# Patient Record
Sex: Male | Born: 1970 | Race: Asian | Hispanic: No | Marital: Single | Smoking: Former smoker
Health system: Southern US, Community
[De-identification: ages and names within clinical notes are randomized; demographics above are authoritative.]

---

## 2013-06-06 ENCOUNTER — Ambulatory Visit (INDEPENDENT_AMBULATORY_CARE_PROVIDER_SITE_OTHER): Payer: BC Managed Care – PPO | Admitting: Emergency Medicine

## 2013-06-06 VITALS — BP 122/74 | HR 80 | Temp 98.0°F | Resp 17 | Ht 64.5 in | Wt 163.0 lb

## 2013-06-06 DIAGNOSIS — S335XXA Sprain of ligaments of lumbar spine, initial encounter: Secondary | ICD-10-CM

## 2013-06-06 MED ORDER — NAPROXEN SODIUM 550 MG PO TABS: 550.0000 mg | ORAL_TABLET | Freq: Two times a day (BID) | ORAL | Status: DC

## 2013-06-06 MED ORDER — CYCLOBENZAPRINE HCL 10 MG PO TABS: 10.0000 mg | ORAL_TABLET | Freq: Three times a day (TID) | ORAL | Status: AC | PRN

## 2013-06-06 MED ORDER — TRAMADOL HCL 50 MG PO TABS: 50.0000 mg | ORAL_TABLET | Freq: Three times a day (TID) | ORAL | Status: AC | PRN

## 2013-06-06 NOTE — Progress Notes (Signed)
[facility] [address], [place] [address] [phone]  Date:  06/06/2013   Name:  Danny Lin   DOB:  Jan 08, 1971   MRN:  [id number]  PCP:  No primary provider on file.    Chief Complaint: Back Pain   History of Present Illness:  Artice Bergerson is a 42 y.o. very pleasant male patient who presents with the following:  3 day history of right buttock pain into right thigh posteriorly. No neuro symptoms.  No history of injury.  Works in a [employer] and is constantly bent, squatting or hunched over.  No prior history of low back problems. No improvement with over the counter medications or other home remedies. Denies other complaint or health concern today.   There are no active problems to display for this patient.   No past medical history on file.  No past surgical history on file.  History  Substance Use Topics  . Smoking status: Never Smoker   . Smokeless tobacco: Not on file  . Alcohol Use: Not on file    No family history on file.  No Known Allergies  Medication list has been reviewed and updated.  No current outpatient prescriptions on file prior to visit.   No current facility-administered medications on file prior to visit.    Review of Systems:  As per HPI, otherwise negative.    Physical Examination: Filed Vitals:   06/06/13 0935  BP: 122/74  Pulse: 80  Temp: 98 F (36.7 C)  Resp: 17   Filed Vitals:   06/06/13 0935  Height: 5' 4.5" (1.638 m)  Weight: 163 lb (73.936 kg)   Body mass index is 27.56 kg/(m^2). Ideal Body Weight: Weight in (lb) to have BMI = 25: 147.6   GEN: WDWN, NAD, Non-toxic, Alert & Oriented x 3 HEENT: Atraumatic, Normocephalic.  Ears and Nose: No external deformity. EXTR: No clubbing/cyanosis/edema NEURO: Normal gait.  PSYCH: Normally interactive. Conversant. Not depressed or anxious appearing.  Calm demeanor.  BACK:  Supple and not tender.  Neuro intact.   Assessment and Plan: Lumbar  strain Anaprox Flexeril Ultram   Signed,  Phillips Odor, MD

## 2013-06-06 NOTE — Patient Instructions (Signed)

## 2013-06-28 ENCOUNTER — Ambulatory Visit (INDEPENDENT_AMBULATORY_CARE_PROVIDER_SITE_OTHER): Payer: No Typology Code available for payment source | Admitting: Family Medicine

## 2013-06-28 VITALS — BP 117/88 | HR 80 | Temp 97.7°F | Resp 18 | Wt 161.0 lb

## 2013-06-28 DIAGNOSIS — M629 Disorder of muscle, unspecified: Secondary | ICD-10-CM

## 2013-06-28 DIAGNOSIS — M25559 Pain in unspecified hip: Secondary | ICD-10-CM

## 2013-06-28 DIAGNOSIS — M25551 Pain in right hip: Secondary | ICD-10-CM

## 2013-06-28 DIAGNOSIS — M7631 Iliotibial band syndrome, right leg: Secondary | ICD-10-CM

## 2013-06-28 MED ORDER — NAPROXEN SODIUM 550 MG PO TABS: 550.0000 mg | ORAL_TABLET | Freq: Two times a day (BID) | ORAL | Status: AC

## 2013-06-28 NOTE — Patient Instructions (Signed)
Iliotibial Band Syndrome  with Rehab  The iliotibial (IT) band is a tendon that connects the hip muscles to the shinbone (tibia) and to one of the bones of the pelvis (ileum). The IT band passes by the knee and is often irritated by the outer portion of the knee (lateral femoral condyle). A fluid filled sac (bursa) exists between the tendon and the bone, to cushion and reduce friction. Overuse of the tendon may cause excessive friction, which results in IT band syndrome. This condition involves inflammation of the bursa (bursitis) and/or inflammation of the IT band (tendinitis).  SYMPTOMS   · Pain, tenderness, swelling, warmth, or redness over the IT band, at the outer knee (above the joint).  · Pain that travels up or down the thigh or leg.  · Initially, pain at the beginning of an exercise, that decreases once warmed up. Eventually, pain throughout the activity, getting worse as the activity continues. May cause the athlete to stop in the middle of training or competing.  · Pain that gets worse when running down hills or stairs, on banked tracks, or next to the curb on the street.  · Pain that increases when the foot of the affected leg hits the ground.  · Possibly, a crackling sound (crepitation) when the tendon or bursa is moved or touched.  CAUSES   IT band syndrome is caused by irritation of the IT band and the underlying bursa. This eventually results in inflammation and pain. IT band syndrome is an overuse injury.   RISK INCREASES WITH:  · Sports with repetitive knee-bending activities (distance running, cycling).  · Incorrect training techniques, including sudden changes in the intensity, frequency, or duration of training.  · Not enough rest between workouts.  · Poor strength and flexibility, especially a tight IT band.  · Failure to warm up properly before activity.  · Bow legs.  · Arthritis of the knee.  PREVENTION   · Warm up and stretch properly before activity.  · Allow for adequate recovery between  workouts.  · Maintain physical fitness:  · Strength, flexibility, and endurance.  · Cardiovascular fitness.  · Learn and use proper training technique, including reducing running mileage, shortening stride, and avoiding running on hills and banked surfaces.  · Wear arch supports (orthotics), if you have flat feet.  PROGNOSIS   If treated properly, IT band syndrome usually goes away within 6 weeks of treatment.  RELATED COMPLICATIONS   · Longer healing time, if not properly treated, or if not given enough time to heal.  · Recurring inflammation of the tendon and bursa, that may result in a chronic condition.  · Recurring symptoms, if activity is resumed too soon, with overuse, with a direct blow, or with poor training technique.  · Inability to complete training or competition.  TREATMENT   Treatment first involves the use of ice and medicine, to reduce pain and inflammation. The use of strengthening and stretching exercises may help reduce pain with activity. These exercises may be performed at home or with a therapist. For individuals with flat feet, an arch support (orthotic) may be helpful. Some individuals find that wearing a knee sleeve or compression bandage around the knee during workouts provides some relief. Certain training techniques, such as adjusting stride length, avoiding running on hills or stairs, changing the direction you run on a circular or banked track, or changing the side of the road you run on, if you run next to the curb, may help decrease symptoms   of IT band syndrome. Cyclists may need to change the seat height or foot position on their bicycles. An injection of cortisone into the bursa may be recommended. Surgery to remove the inflamed bursa and/or part of the IT band is only considered after at least 6 months of non-surgical treatment.   MEDICATION   · If pain medicine is needed, nonsteroidal anti-inflammatory medicines (aspirin and ibuprofen), or other minor pain relievers  (acetaminophen), are often advised.  · Do not take pain medicine for 7 days before surgery.  · Prescription pain relievers may be given, if your caregiver thinks they are needed. Use only as directed and only as much as you need.  · Corticosteroid injections may be given by your caregiver. These injections should be reserved for the most serious cases, because they may only be given a certain number of times.  HEAT AND COLD  · Cold treatment (icing) should be applied for 10 to 15 minutes every 2 to 3 hours for inflammation and pain, and immediately after activity that aggravates your symptoms. Use ice packs or an ice massage.  · Heat treatment may be used before performing stretching and strengthening activities prescribed by your caregiver, physical therapist, or athletic trainer. Use a heat pack or a warm water soak.  SEEK MEDICAL CARE IF:   · Symptoms get worse or do not improve in 2 to 4 weeks, despite treatment.  · New, unexplained symptoms develop. (Drugs used in treatment may produce side effects.)  EXERCISES   RANGE OF MOTION (ROM) AND STRETCHING EXERCISES - Iliotibial Band Syndrome  These exercises may help you when beginning to rehabilitate your injury. Your symptoms may go away with or without further involvement from your physician, physical therapist or athletic trainer. While completing these exercises, remember:   · Restoring tissue flexibility helps normal motion to return to the joints. This allows healthier, less painful movement and activity.  · An effective stretch should be held for at least 30 seconds.  · A stretch should never be painful. You should only feel a gentle lengthening or release in the stretched tissue.  STRETCH - Quadriceps, Prone   · Lie on your stomach on a firm surface, such as a bed or padded floor.  · Bend your right / left knee and grasp your ankle. If you are unable to reach your ankle or pant leg, use a belt around your foot to lengthen your reach.  · Gently pull your heel  toward your buttocks. Your knee should not slide out to the side. You should feel a stretch in the front of your thigh and knee.  · Hold this position for __________ seconds.  Repeat __________ times. Complete this stretch __________ times per day.   STRETCH  Iliotibial Band  · On the floor or bed, lie on your side, so your right / left leg is on top. Bend your knee and grab your ankle.  · Slowly bring your knee back so that your thigh is in line with your trunk. Keep your heel at your buttocks and gently arch your back, so your head, shoulders and hips line up.  · Slowly lower your leg so that your knee approaches the floor or bed, until you feel a gentle stretch on the outside of your right / left thigh. If you do not feel a stretch and your knee will not fall farther, place the heel of your opposite foot on top of your knee, and pull your thigh down farther.  ·   Hold this stretch for __________ seconds.  Repeat __________ times. Complete this stretch __________ times per day.  STRENGTHENING EXERCISES - Iliotibial Band Syndrome  Improving the flexibility of the IT band will best relieve your discomfort due to IT band syndrome. Strengthening exercises, however, can help improve both muscle endurance and joint mechanics, reducing the factors that can contribute to this condition. Your physician, physical therapist or athletic trainer may provide you with exercises that train specific muscle groups that are especially weak. The following exercises target muscles that are often weak in people who have IT band syndrome.  STRENGTH - Hip Abductors, Straight Leg Raises   Be aware of your form throughout the entire exercise, so that you exercise the correct muscles. Poor form means that you are not strengthening the correct muscles.  · Lie on your side, so that your head, shoulders, knee and hip line up. You may bend your lower knee to help maintain your balance. Your right / left leg should be on top.  · Roll your hips  slightly forward, so that your hips are stacked directly over each other and your right / left knee is facing forward.  · Lift your top leg up 4-6 inches, leading with your heel. Be sure that your foot does not drift forward and that your knee does not roll toward the ceiling.  · Hold this position for __________ seconds. You should feel the muscles in your outer hip lifting (you may not notice this until your leg begins to tire).  · Slowly lower your leg to the starting position. Allow the muscles to fully relax before beginning the next repetition.  Repeat __________ times. Complete this exercise __________ times per day.   STRENGTH - Quad/VMO, Isometric  · Sit in a chair with your right / left knee slightly bent. With your fingertips, feel the VMO muscle (just above the inside of your knee). The VMO is important in controlling the position of your kneecap.  · Keeping your fingertips on this muscle. Without actually moving your leg, attempt to drive your knee down, as if straightening your leg. You should feel your VMO tense. If you have a difficult time, you may wish to try the same exercise on your healthy knee first.  · Tense this muscle as hard as you can, without increasing any knee pain.  · Hold for __________ seconds. Relax the muscles slowly and completely between each repetition.  Repeat __________ times. Complete this exercise __________ times per day.   Document Released: 06/02/2005 Document Revised: 08/25/2011 Document Reviewed: 09/14/2008  [employer]® Patient Information ©2014 [employer].

## 2013-06-28 NOTE — Progress Notes (Signed)
   Subjective:    Patient ID: Danny Lin, male    DOB: 01/21/1971, 43 y.o.   MRN: [id number]  HPI Danny Fosterhanh Leach is here for right posterior leg pain.  He reports pain in the back of his right leg for about 1 month.  He was seen in December and given flexeril and naprosyn.  He took them regularly for about 2 weeks then stopped.  He states he is about 50% improved since this started.  The pain is described as "tightness."  It is worse with certain movements, particularly rolling and first steps.  The pain will go from the outside of his hip down to his knee.  Denies any back or buttock pain.  No pain below the knee.  No weakness, numbness or tingling in the legs.  No pain at rest, but will sometimes get a little tightness in the right leg if sitting for a prolonged time.  He is a runner in the summer; he does not stretch.  He has been doing some walking on a treadmill and his discomfort typically will improve after a few minutes of walking.  Current Outpatient Prescriptions on File Prior to Visit  Medication Sig Dispense Refill  . cyclobenzaprine (FLEXERIL) 10 MG tablet Take 1 tablet (10 mg total) by mouth 3 (three) times daily as needed for muscle spasms.  30 tablet  0  . traMADol (ULTRAM) 50 MG tablet Take 1 tablet (50 mg total) by mouth every 8 (eight) hours as needed.  30 tablet  0   No current facility-administered medications on file prior to visit.    PMHx: none  FHx: none SHx: former smoker  Review of Systems See HPI    Objective:   Physical Exam BP 117/88  Pulse 80  Temp(Src) 97.7 F (36.5 C) (Oral)  Resp 18  Wt 161 lb (73.029 kg)  SpO2 97% Gen: alert, cooperative, NAD Back: no erythema or edema; no vertebral tenderness; negative SLR bilaterally; no SI or piriformis tenderness; full range of motion with reproduction of pain with extension; negative FABER bilaterally Right hip: no erythema or edema; full range of motion without pain; no greater trochanter tenderness; no point  tenderness; pain with stretching of right IT band Ext: no edema; 2+ DP pulses Neuro: 5/5 strength in hip flexion, quads and hamstrings; sensation grossly intact to light touch in lower extremities       Assessment & Plan:  Healthy 43 yo M with right leg pain most consistent with IT band syndrome.  # Right IT band syndrome: Location is not classic, but history is otherwise consistent. - ice 3 times a day - naprosyn 500mg  BID x7 days then prn - exercises given and demonstrated - follow up in 2 weeks if not improving

## 2013-06-30 NOTE — Progress Notes (Signed)
History and physical examinations obtained with Dr. Piedad ClimesHonig.  Agree with A/P.

## 2014-10-10 ENCOUNTER — Other Ambulatory Visit: Payer: Self-pay | Admitting: Family Medicine

## 2014-10-10 ENCOUNTER — Ambulatory Visit
Admission: RE | Admit: 2014-10-10 | Discharge: 2014-10-10 | Disposition: A | Discharge status: Home / Self Care | Payer: 59 | Source: Ambulatory Visit | Attending: Family Medicine | Admitting: Family Medicine

## 2014-10-10 DIAGNOSIS — R109 Unspecified abdominal pain: Secondary | ICD-10-CM

## 2014-10-10 DIAGNOSIS — R1031 Right lower quadrant pain: Secondary | ICD-10-CM

## 2015-10-29 IMAGING — CT CT ABD-PELV W/O CM
2 of 3 series · 13 of 36 positions shown, 18 images · non-contrast
Comparison: None.

CLINICAL DATA: Right flank pain beginning 10/10/2014 which resolved
this morning.

EXAM:
CT ABDOMEN AND PELVIS WITHOUT CONTRAST
TECHNIQUE: Multidetector CT imaging of the abdomen and pelvis was performed
following the standard protocol without IV contrast.

[Series 2: renal stone 5.0 i41s 3 · axial · 0.76mm/px · z∈[-365,+50]mm · 12 of 95 slices shown, 16 images]
[im 8/95  soft-tissue]
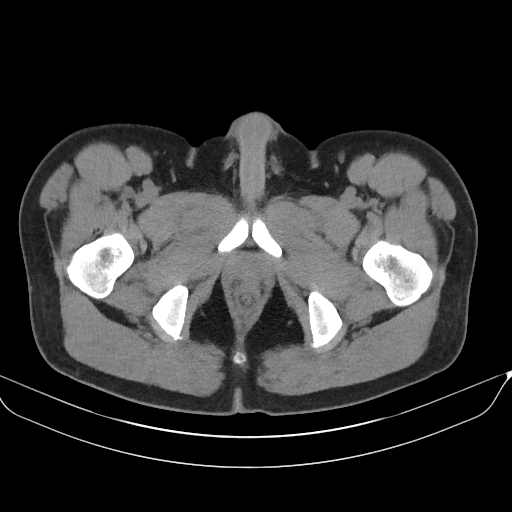
[im 8/95  bone]
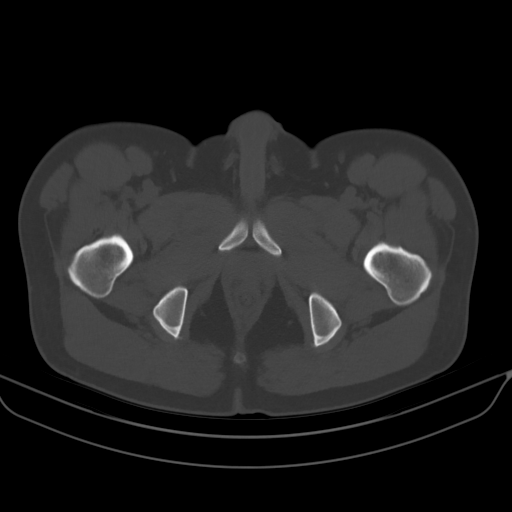
[im 16/95  soft-tissue]
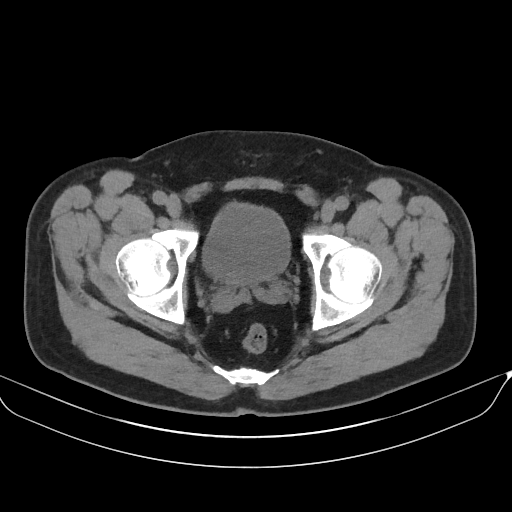
[im 24/95  soft-tissue]
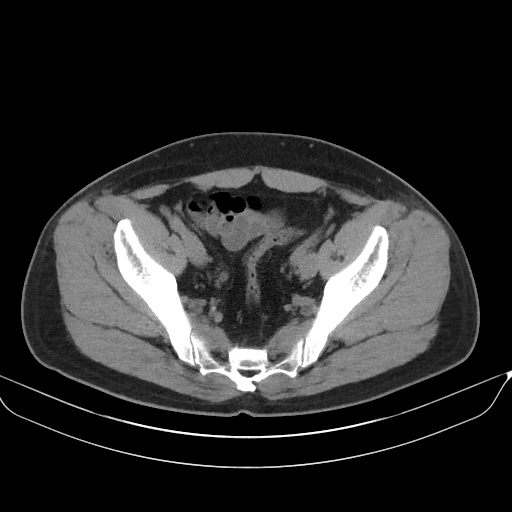
[im 36/95  soft-tissue]
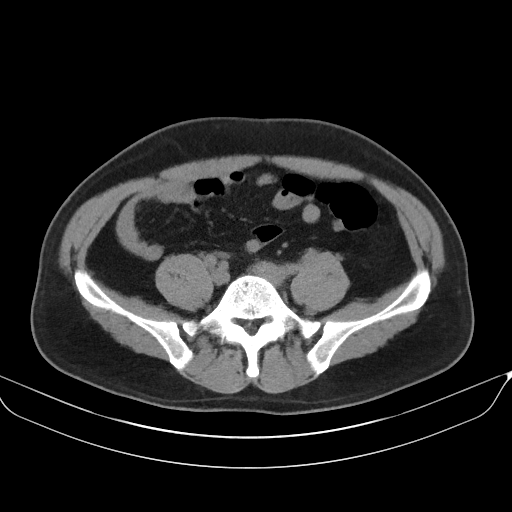
[im 44/95  soft-tissue]
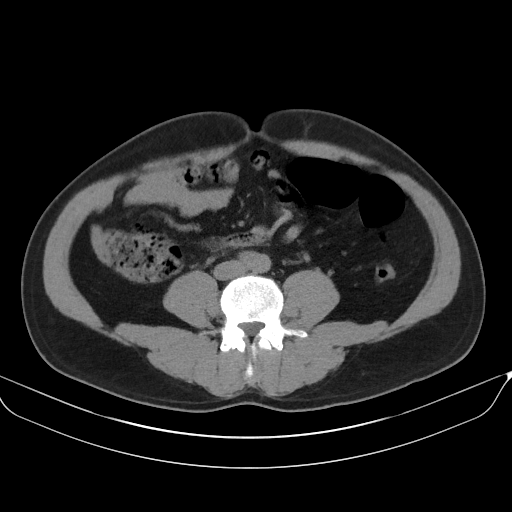
[im 51/95  soft-tissue]
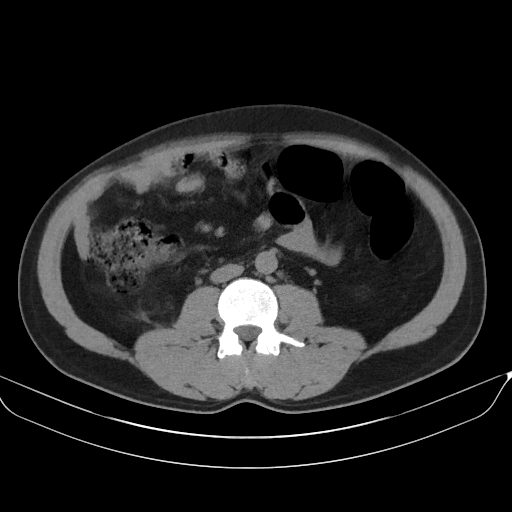
[im 59/95  soft-tissue]
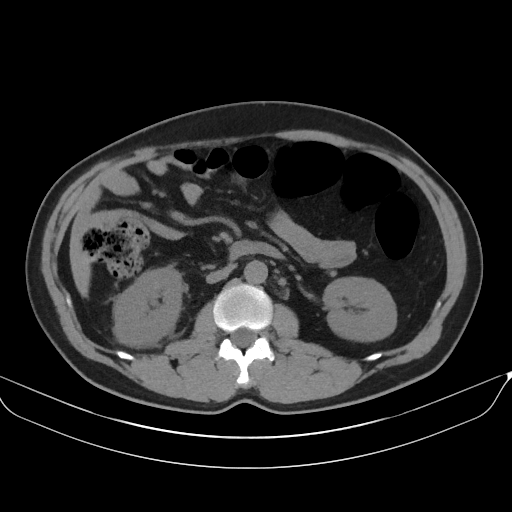
[im 71/95  soft-tissue]
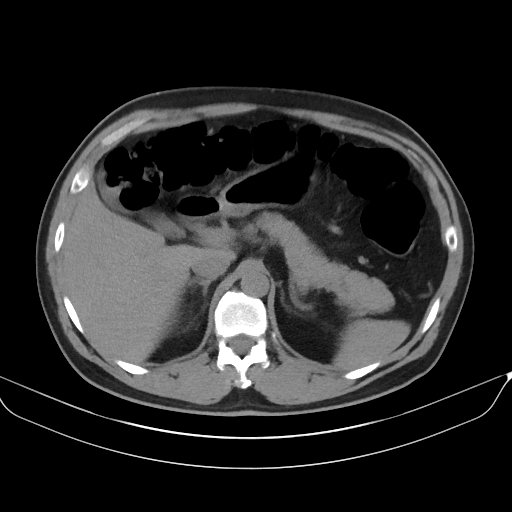
[im 79/95  soft-tissue]
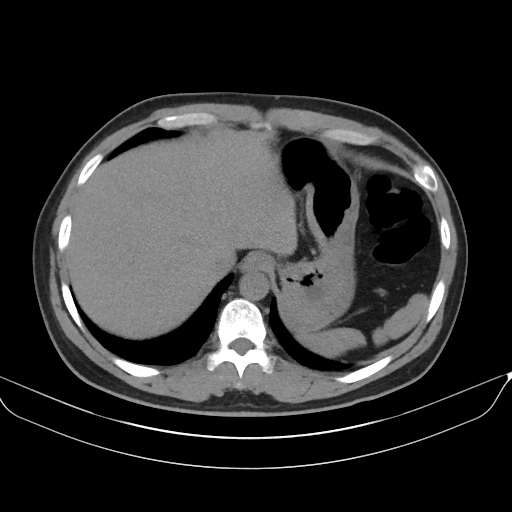
[im 79/95  lung]
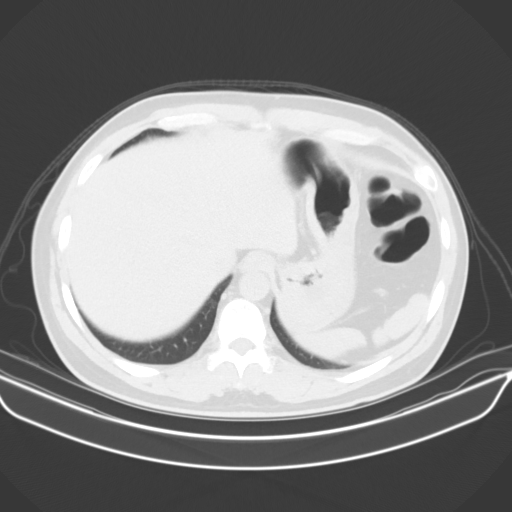
[im 79/95  bone]
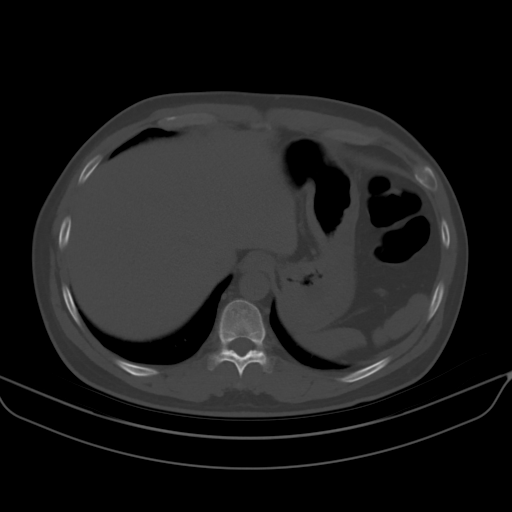
[im 83/95  lung]
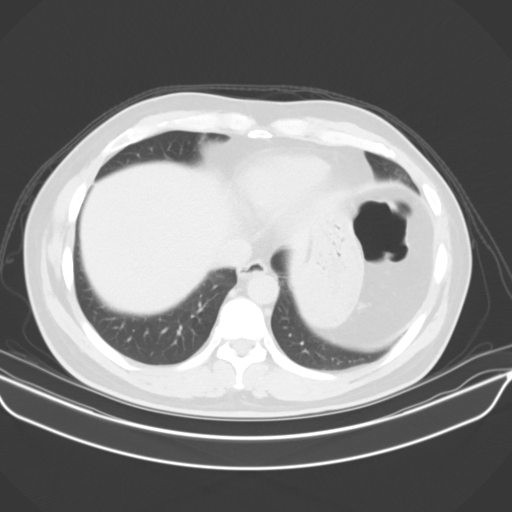
[im 87/95  soft-tissue]
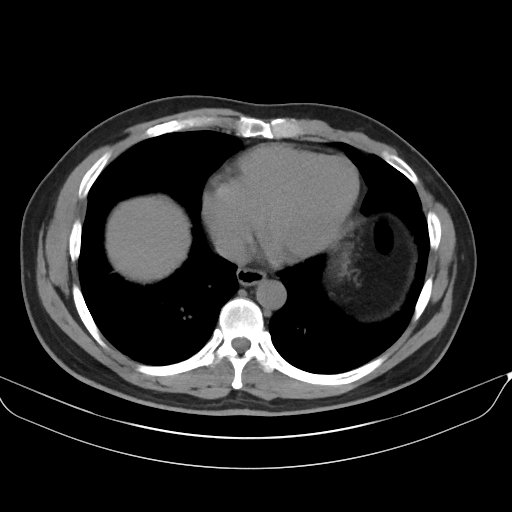
[im 87/95  lung]
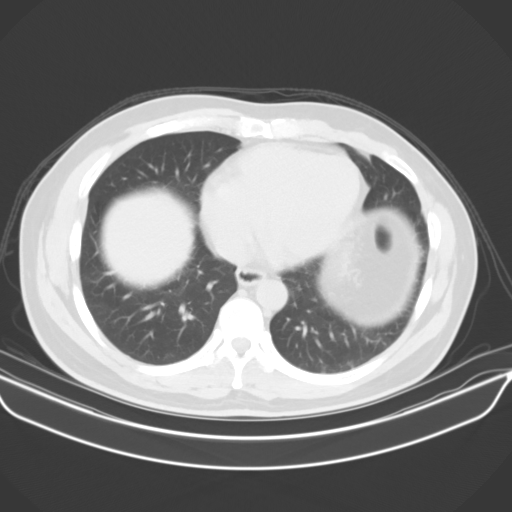
[im 91/95  lung]
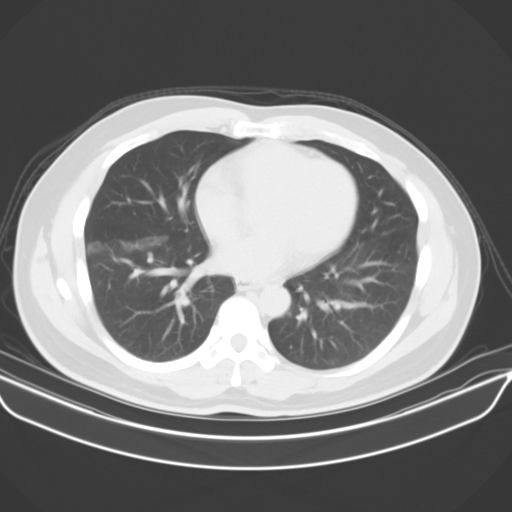

[Series 4: abd pelvis wo 2.0 sag · sagittal · 0.57mm/px · 1 of 155 slices shown, 2 images]
[im 52/155  soft-tissue]
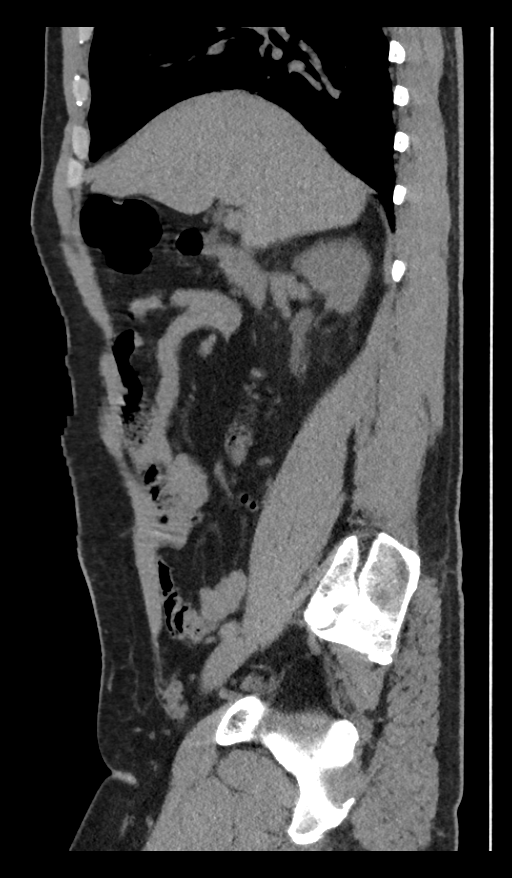
[im 52/155  bone]
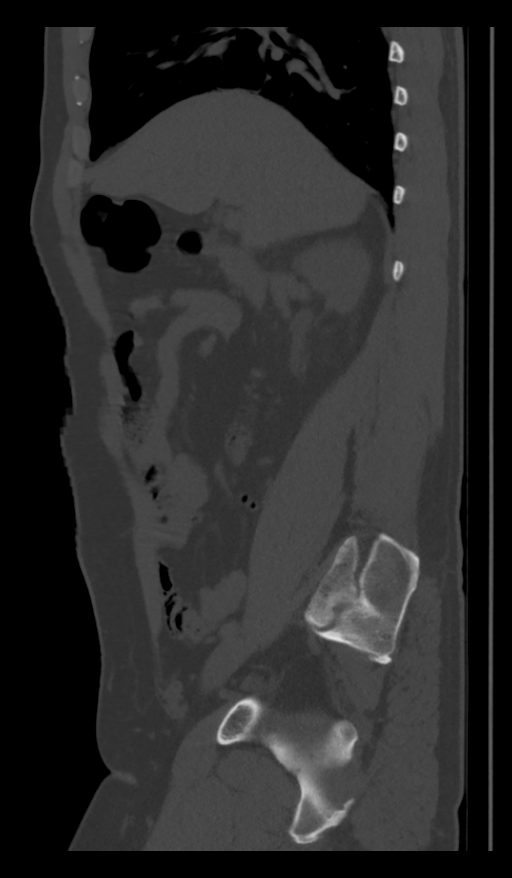

[13 of 36 positions shown; findings below may reference images not displayed]

FINDINGS: The lung bases are clear.  No pleural or pericardial effusion.

There is stranding about the right kidney and ureter and mild right
hydronephrosis. A 0.3 cm stone is seen at the right UVJ. No other
urinary tract stones are identified on the right or left.

The gallbladder, liver, spleen, adrenal glands and pancreas appear
normal. The prostate gland and seminal vesicles are unremarkable.
The stomach, small and large bowel and appendix appear normal. There
is no lymphadenopathy or fluid. No lytic or sclerotic bony lesion is
identified.
IMPRESSION: Mild right hydronephrosis due to a 0.3 cm stone at the right UVJ.
The examination is otherwise negative.

## 2022-09-01 DIAGNOSIS — Z0000 Encounter for general adult medical examination without abnormal findings: Secondary | ICD-10-CM | POA: Diagnosis not present

## 2022-09-01 DIAGNOSIS — E785 Hyperlipidemia, unspecified: Secondary | ICD-10-CM | POA: Diagnosis not present

## 2022-09-01 DIAGNOSIS — Z125 Encounter for screening for malignant neoplasm of prostate: Secondary | ICD-10-CM | POA: Diagnosis not present

## 2023-09-29 DIAGNOSIS — E785 Hyperlipidemia, unspecified: Secondary | ICD-10-CM | POA: Diagnosis not present

## 2023-09-29 DIAGNOSIS — Z125 Encounter for screening for malignant neoplasm of prostate: Secondary | ICD-10-CM | POA: Diagnosis not present

## 2023-09-29 DIAGNOSIS — Z0000 Encounter for general adult medical examination without abnormal findings: Secondary | ICD-10-CM | POA: Diagnosis not present
